# Patient Record
Sex: Female | Born: 1980 | Race: Black or African American | Hispanic: No | Marital: Single | Smoking: Never smoker
Health system: Southern US, Community
[De-identification: ages and names within clinical notes are randomized; demographics above are authoritative.]

## PROBLEM LIST (undated history)

## (undated) DIAGNOSIS — Z8742 Personal history of other diseases of the female genital tract: Secondary | ICD-10-CM

## (undated) DIAGNOSIS — B009 Herpesviral infection, unspecified: Secondary | ICD-10-CM

## (undated) DIAGNOSIS — R102 Pelvic and perineal pain: Secondary | ICD-10-CM

## (undated) HISTORY — DX: Herpesviral infection, unspecified: B00.9

## (undated) HISTORY — DX: Personal history of other diseases of the female genital tract: Z87.42

## (undated) HISTORY — DX: Pelvic and perineal pain: R10.2

---

## 2012-03-10 ENCOUNTER — Encounter: Payer: Self-pay | Admitting: Obstetrics and Gynecology

## 2012-03-10 ENCOUNTER — Ambulatory Visit (INDEPENDENT_AMBULATORY_CARE_PROVIDER_SITE_OTHER): Payer: PRIVATE HEALTH INSURANCE | Admitting: Obstetrics and Gynecology

## 2012-03-10 VITALS — BP 90/62 | HR 68 | Resp 16 | Ht 64.0 in | Wt 150.0 lb

## 2012-03-10 DIAGNOSIS — N946 Dysmenorrhea, unspecified: Secondary | ICD-10-CM

## 2012-03-10 DIAGNOSIS — D219 Benign neoplasm of connective and other soft tissue, unspecified: Secondary | ICD-10-CM

## 2012-03-10 DIAGNOSIS — D259 Leiomyoma of uterus, unspecified: Secondary | ICD-10-CM

## 2012-03-10 DIAGNOSIS — N926 Irregular menstruation, unspecified: Secondary | ICD-10-CM | POA: Insufficient documentation

## 2012-03-10 DIAGNOSIS — E282 Polycystic ovarian syndrome: Secondary | ICD-10-CM | POA: Insufficient documentation

## 2012-03-10 MED ORDER — MISOPROSTOL 200 MCG PO TABS: ORAL_TABLET | ORAL | Status: DC

## 2012-03-10 MED ORDER — NORETHIN-ETH ESTRADIOL-FE 0.8-25 MG-MCG PO CHEW: 1.0000 | CHEWABLE_TABLET | Freq: Every day | ORAL | Status: DC

## 2012-03-10 NOTE — Progress Notes (Signed)
PCOS WITH ABNORMAL UTERINE BLEEDING ON BCPs  SUBJECTIVE: Pt on extended cycle BCPs for abnormal uterine bleeding and PCOS as well as severe dysmenorrhea.  BCPs are not totally effective in stopping bleeding and are financially prohibitive. Wants to consider another option.   Denies intermenstrual pain.No other GI symptoms or urinary symptoms.  EXAM: BP 90/62  Pulse 68  Resp 16  Ht 5\' 4"  (1.626 m)  Wt 150 lb (68.04 kg)  BMI 25.75 kg/m2  LMP 02/12/2012 Pelvic exam:  VULVA: normal appearing vulva with no masses, tenderness or lesions,    VAGINA: normal appearing vagina with normal color and discharge, no lesions,    CERVIX: normal appearing cervix without discharge or lesions, cervical motion tenderness absent,    UTERUS: enlarged to 8 week's size,    ADNEXA: normal adnexa in size, nontender and no masses,    RECTAL: normal rectal, no masses. ASSESSMENT: PCOS    Abnormal uterine bleeding on extended cycle BCPs    Fibroids    Dysmenorrhea  RECOMMENDATION: Consider Mirena IUD for relief of several symptoms     Pt agrees     Continue BCPs until IUD is placed     Cytotec for cervical ripening prior to IUD placement

## 2012-03-10 NOTE — Patient Instructions (Addendum)
Continue taking active BCPs until IUD insertion. (Sample given) Cytotec :  1 tablet in vagina 12 hours before IUD appt                                1 tablet in vagina 6 hours before IUD appt   Levonorgestrel intrauterine device (IUD) What is this medicine? LEVONORGESTREL IUD (LEE voe nor jes trel) is a contraceptive (birth control) device. It is used to prevent pregnancy and to treat heavy bleeding that occurs during your period. It can be used for up to 5 years. This medicine may be used for other purposes; ask your health care provider or pharmacist if you have questions. What should I tell my health care provider before I take this medicine? They need to know if you have any of these conditions: -abnormal Pap smear -cancer of the breast, uterus, or cervix -diabetes -endometritis -genital or pelvic infection now or in the past -have more than one sexual partner or your partner has more than one partner -heart disease -history of an ectopic or tubal pregnancy -immune system problems -IUD in place -liver disease or tumor -problems with blood clots or take blood-thinners -use intravenous drugs -uterus of unusual shape -vaginal bleeding that has not been explained -an unusual or allergic reaction to levonorgestrel, other hormones, silicone, or polyethylene, medicines, foods, dyes, or preservatives -pregnant or trying to get pregnant -breast-feeding How should I use this medicine? This device is placed inside the uterus by a health care professional. Talk to your pediatrician regarding the use of this medicine in children. Special care may be needed. Overdosage: If you think you have taken too much of this medicine contact a poison control center or emergency room at once. NOTE: This medicine is only for you. Do not share this medicine with others. What if I miss a dose? This does not apply. What may interact with this medicine? Do not take this medicine with any of the  following medications: -amprenavir -bosentan -fosamprenavir This medicine may also interact with the following medications: -aprepitant -barbiturate medicines for inducing sleep or treating seizures -bexarotene -griseofulvin -medicines to treat seizures like carbamazepine, ethotoin, felbamate, oxcarbazepine, phenytoin, topiramate -modafinil -pioglitazone -rifabutin -rifampin -rifapentine -some medicines to treat HIV infection like atazanavir, indinavir, lopinavir, nelfinavir, tipranavir, ritonavir -St. John's wort -warfarin This list may not describe all possible interactions. Give your health care provider a list of all the medicines, herbs, non-prescription drugs, or dietary supplements you use. Also tell them if you smoke, drink alcohol, or use illegal drugs. Some items may interact with your medicine. What should I watch for while using this medicine? Visit your doctor or health care professional for regular check ups. See your doctor if you or your partner has sexual contact with others, becomes HIV positive, or gets a sexual transmitted disease. This product does not protect you against HIV infection (AIDS) or other sexually transmitted diseases. You can check the placement of the IUD yourself by reaching up to the top of your vagina with clean fingers to feel the threads. Do not pull on the threads. It is a good habit to check placement after each menstrual period. Call your doctor right away if you feel more of the IUD than just the threads or if you cannot feel the threads at all. The IUD may come out by itself. You may become pregnant if the device comes out. If you notice that the IUD  has come out use a backup birth control method like condoms and call your health care provider. Using tampons will not change the position of the IUD and are okay to use during your period. What side effects may I notice from receiving this medicine? Side effects that you should report to your  doctor or health care professional as soon as possible: -allergic reactions like skin rash, itching or hives, swelling of the face, lips, or tongue -fever, flu-like symptoms -genital sores -high blood pressure -no menstrual period for 6 weeks during use -pain, swelling, warmth in the leg -pelvic pain or tenderness -severe or sudden headache -signs of pregnancy -stomach cramping -sudden shortness of breath -trouble with balance, talking, or walking -unusual vaginal bleeding, discharge -yellowing of the eyes or skin Side effects that usually do not require medical attention (report to your doctor or health care professional if they continue or are bothersome): -acne -breast pain -change in sex drive or performance -changes in weight -cramping, dizziness, or faintness while the device is being inserted -headache -irregular menstrual bleeding within first 3 to 6 months of use -nausea This list may not describe all possible side effects. Call your doctor for medical advice about side effects. You may report side effects to FDA at 1-800-FDA-1088. Where should I keep my medicine? This does not apply. NOTE: This sheet is a summary. It may not cover all possible information. If you have questions about this medicine, talk to your doctor, pharmacist, or health care provider.  2012, [employer]. (11/11/2008 6:39:08 PM)

## 2012-04-07 ENCOUNTER — Encounter: Payer: PRIVATE HEALTH INSURANCE | Admitting: Obstetrics and Gynecology

## 2012-09-22 ENCOUNTER — Other Ambulatory Visit: Payer: Self-pay | Admitting: Obstetrics and Gynecology

## 2012-09-22 MED ORDER — LEVONORGESTREL-ETHINYL ESTRAD 0.1-20 MG-MCG PO TABS: 1.0000 | ORAL_TABLET | Freq: Every day | ORAL | Status: DC

## 2012-09-22 NOTE — Telephone Encounter (Signed)
Returned pt call sending rx of BC until annual. Aex scheduled 12/07/12 with [facility] Pt voiced understanding Debby Clyne, Herbert Seta

## 2012-12-07 ENCOUNTER — Encounter: Payer: Self-pay | Admitting: Obstetrics and Gynecology

## 2012-12-07 ENCOUNTER — Ambulatory Visit: Payer: BC Managed Care – PPO | Admitting: Obstetrics and Gynecology

## 2012-12-07 VITALS — BP 100/78 | HR 64 | Ht 64.0 in | Wt 151.0 lb

## 2012-12-07 DIAGNOSIS — D219 Benign neoplasm of connective and other soft tissue, unspecified: Secondary | ICD-10-CM

## 2012-12-07 DIAGNOSIS — Z124 Encounter for screening for malignant neoplasm of cervix: Secondary | ICD-10-CM

## 2012-12-07 DIAGNOSIS — R5383 Other fatigue: Secondary | ICD-10-CM

## 2012-12-07 DIAGNOSIS — E282 Polycystic ovarian syndrome: Secondary | ICD-10-CM

## 2012-12-07 DIAGNOSIS — N946 Dysmenorrhea, unspecified: Secondary | ICD-10-CM

## 2012-12-07 DIAGNOSIS — Z202 Contact with and (suspected) exposure to infections with a predominantly sexual mode of transmission: Secondary | ICD-10-CM

## 2012-12-07 DIAGNOSIS — N926 Irregular menstruation, unspecified: Secondary | ICD-10-CM

## 2012-12-07 LAB — HEMOGLOBIN: Hemoglobin: 11.6 g/dL — ABNORMAL LOW (ref 12.0–15.0)

## 2012-12-07 LAB — HIV ANTIBODY (ROUTINE TESTING W REFLEX): HIV: NONREACTIVE

## 2012-12-07 MED ORDER — LEVONORGESTREL-ETHINYL ESTRAD 0.1-20 MG-MCG PO TABS: ORAL_TABLET | ORAL | Status: DC

## 2012-12-07 NOTE — Progress Notes (Signed)
Subjective:  Last Pap: 10/11/10, due today per last note WNL: Yes Regular Periods:yes Contraception: pill   Monthly Breast exam:yes Tetanus<16yrs:yes Nl.Bladder Function:yes Daily BMs:no Healthy Diet:yes somewhat  Calcium:no Mammogram:no Date of Mammogram: n/a Exercise:yes Have often Exercise: 2-3 times per week  Seatbelt: yes Abuse at home: no Stressful work:yes sometimes  Sigmoid-colonoscopy: n/a Bone Density: No PCP: none, Pt would like referral  Change in PMH: no changes  Change in [facility]: no changes   Whitney Bradford is a 32 y.o. female, G0P0, who presents for an annual exam. C/o fatigue evern after 6 hrs sleep nightly.  Has hx low vit D.  Has mild ice pica.    History   Social History  . Marital Status: Single    Spouse Name: N/A    Number of Children: N/A  . Years of Education: N/A   Social History Main Topics  . Smoking status: Never Smoker   . Smokeless tobacco: None  . Alcohol Use: Yes  . Drug Use: Yes    Special: Marijuana  . Sexually Active: Yes    Birth Control/ Protection: Pill     Comment: aleese    Other Topics Concern  . None   Social History Narrative  . None    Menstrual cycle:   LMP: Patient's last menstrual period was 11/16/2012.           Cycle: Monthly menses heavy, but not as heavy as prior to surgery.  Tried extended cycle  BCP regimen, but insurance company would not cover it.  The following portions of the patient's history were reviewed and updated as appropriate: allergies, current medications, past family history, past medical history, past social history, past surgical history and problem list.  Review of Systems Pertinent items are noted in HPI. Breast:Negative for breast lump,nipple discharge or nipple retraction Gastrointestinal: Negative for abdominal pain, change in bowel habits or rectal bleeding Urinary:negative   Objective:    BP 100/78  Pulse 64  Ht 5\' 4"  (1.626 m)  Wt 151 lb (68.493 kg)  BMI 25.92 kg/m2  LMP  11/16/2012    Weight:  Wt Readings from Last 1 Encounters:  12/07/12 151 lb (68.493 kg)          BMI: Body mass index is 25.92 kg/(m^2).  General Appearance: Alert, appropriate appearance for age. No acute distress HEENT: Grossly normal Neck / Thyroid: Supple, no masses, nodes or enlargement Lungs: clear to auscultation bilaterally Back: No CVA tenderness Breast Exam: No masses or nodes.No dimpling, nipple retraction or discharge. Cardiovascular: Regular rate and rhythm. S1, S2, no murmur Gastrointestinal: Soft, non-tender, no masses or organomegaly Pelvic Exam: External genitalia: normal general appearance Vaginal: normal mucosa without prolapse or lesions Cervix: normal appearance Adnexa: no masses noted Uterus: ULNS Rectovaginal: normal rectal, no masses Lymphatic Exam: Non-palpable nodes in neck, clavicular, axillary, or inguinal regions Skin: no rash or abnormalities Neurologic: Normal gait and speech, no tremor  Psychiatric: Alert and oriented, appropriate affect.   Wet Prep:not applicable Urinalysis:not applicable UPT: Not done   Assessment:    Known fibroids  Improved menorrhagia fatigue   Plan:    pap smear counseled on breast self exam, use and side effects of OCP's and family planning choices return annually or prn STD screening: done Contraception:oral contraceptives (estrogen/progesterone) HBG, TSH, VITD   Dierdre Forth MD

## 2012-12-08 LAB — VITAMIN D 25 HYDROXY (VIT D DEFICIENCY, FRACTURES): Vit D, 25-Hydroxy: 38 ng/mL (ref 30–89)

## 2012-12-09 LAB — PAP IG, CT-NG NAA, HPV HIGH-RISK
Chlamydia Probe Amp: NEGATIVE
GC Probe Amp: NEGATIVE

## 2014-01-10 ENCOUNTER — Ambulatory Visit: Payer: BC Managed Care – PPO

## 2014-01-10 ENCOUNTER — Ambulatory Visit: Payer: BC Managed Care – PPO | Admitting: Family Medicine

## 2014-01-10 VITALS — BP 110/70 | HR 83 | Temp 98.0°F | Resp 16 | Ht 64.0 in | Wt 155.0 lb

## 2014-01-10 DIAGNOSIS — T148XXA Other injury of unspecified body region, initial encounter: Secondary | ICD-10-CM

## 2014-01-10 DIAGNOSIS — M549 Dorsalgia, unspecified: Secondary | ICD-10-CM

## 2014-01-10 DIAGNOSIS — S20219A Contusion of unspecified front wall of thorax, initial encounter: Secondary | ICD-10-CM

## 2014-01-10 DIAGNOSIS — R079 Chest pain, unspecified: Secondary | ICD-10-CM

## 2014-01-10 DIAGNOSIS — M542 Cervicalgia: Secondary | ICD-10-CM

## 2014-01-10 MED ORDER — METHOCARBAMOL 500 MG PO TABS: 500.0000 mg | ORAL_TABLET | Freq: Three times a day (TID) | ORAL | Status: DC | PRN

## 2014-01-10 MED ORDER — TRAMADOL HCL 50 MG PO TABS: 50.0000 mg | ORAL_TABLET | Freq: Three times a day (TID) | ORAL | Status: DC | PRN

## 2014-01-10 MED ORDER — MELOXICAM 15 MG PO TABS: ORAL_TABLET | ORAL | Status: DC
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
## 2014-04-20 ENCOUNTER — Ambulatory Visit (INDEPENDENT_AMBULATORY_CARE_PROVIDER_SITE_OTHER): Payer: BC Managed Care – PPO | Admitting: Emergency Medicine

## 2014-04-20 VITALS — BP 110/70 | HR 75 | Temp 97.7°F | Resp 20 | Ht 64.5 in | Wt 157.0 lb

## 2014-04-20 DIAGNOSIS — A088 Other specified intestinal infections: Secondary | ICD-10-CM

## 2014-04-20 DIAGNOSIS — A084 Viral intestinal infection, unspecified: Secondary | ICD-10-CM

## 2014-04-20 MED ORDER — LOPERAMIDE HCL 2 MG PO TABS: ORAL_TABLET | ORAL | Status: DC

## 2014-04-20 MED ORDER — ONDANSETRON 8 MG PO TBDP: 8.0000 mg | ORAL_TABLET | Freq: Three times a day (TID) | ORAL | Status: DC | PRN

## 2014-04-20 NOTE — Progress Notes (Signed)
[facility] [address] [place] [address] [phone]  Date:  04/20/2014   Name:  Whitney Bradford   DOB:  19-Jan-1981   MRN:  [id number]  PCP:  Eldred Manges, MD    Chief Complaint: Nausea   History of Present Illness:  Whitney Bradford is a 33 y.o. very pleasant female patient who presents with the following:  Stopped OCP in February.  Now using condoms.  Last period May 31.  Now has nausea and vomiting and fatigue over past four days.  Has diarrhea.  The patient has no complaint of blood, mucous, or pus in her stools. No travel or sketchy water.  No fever or chills.  No rash. No ill contacts.  Had negative HCG at home.  No GU or GYN.  No improvement with over the counter medications or other home remedies. Denies other complaint or health concern today.   Patient Active Problem List   Diagnosis Date Noted  . PCOS (polycystic ovarian syndrome) 03/10/2012  . Dysmenorrhea 03/10/2012  . Fibroid 03/10/2012  . Irregular menses 03/10/2012    Past Medical History  Diagnosis Date  . History of PCOS   . Pelvic pain   . HSV-1 infection     No past surgical history on file.  History  Substance Use Topics  . Smoking status: Never Smoker   . Smokeless tobacco: Not on file  . Alcohol Use: Yes    Family History  Problem Relation Age of Onset  . Hypertension Father   . Hypertension Mother   . Hypertension Sister   . Hypertension Sister     Allergies  Allergen Reactions  . Sulfa Antibiotics Nausea And Vomiting    Medication list has been reviewed and updated.  Current Outpatient Prescriptions on File Prior to Visit  Medication Sig Dispense Refill  . meloxicam (MOBIC) 15 MG tablet Take 1/2 -1 tab PO daily prn, no other NSAIDs, always with food.  30 tablet  0  . methocarbamol (ROBAXIN) 500 MG tablet Take 1 tablet (500 mg total) by mouth every 8 (eight) hours as needed for muscle spasms.  30 tablet  0  . naproxen (NAPROSYN) 250 MG tablet Take  by mouth 2 (two) times daily with a meal.      . traMADol (ULTRAM) 50 MG tablet Take 1 tablet (50 mg total) by mouth every 8 (eight) hours as needed.  30 tablet  0   No current facility-administered medications on file prior to visit.    Review of Systems:  As per HPI, otherwise negative.    Physical Examination: Filed Vitals:   04/20/14 1801  BP: 110/70  Pulse: 75  Temp: 97.7 F (36.5 C)  Resp: 20   Filed Vitals:   04/20/14 1801  Height: 5' 4.5" (1.638 m)  Weight: 157 lb (71.215 kg)   Body mass index is 26.54 kg/(m^2). Ideal Body Weight: Weight in (lb) to have BMI = 25: 147.6  GEN: WDWN, NAD, Non-toxic, A & O x 3 HEENT: Atraumatic, Normocephalic. Neck supple. No masses, No LAD. Ears and Nose: No external deformity. CV: RRR, No M/G/R. No JVD. No thrill. No extra heart sounds. PULM: CTA B, no wheezes, crackles, rhonchi. No retractions. No resp. distress. No accessory muscle use. ABD: S, NT, ND, +BS. No rebound. No HSM. EXTR: No c/c/e NEURO Normal gait.  PSYCH: Normally interactive. Conversant. Not depressed or anxious appearing.  Calm demeanor.    Assessment and Plan: Gastroenteritis Imodium zofran   Signed,  Ellison Carwin, MD

## 2014-04-20 NOTE — Patient Instructions (Signed)
Clear Liquid Diet A clear liquid diet is a short-term diet that is prescribed to provide the necessary fluid and basic energy you need when you can have nothing else. The clear liquid diet consists of liquids or solids that will become liquid at room temperature. You should be able to see through the liquid. There are many reasons that you may be restricted to clear liquids, such as:  When you have a sudden-onset (acute) condition that occurs before or after surgery.  To help your body slowly get adjusted to food again after a long period when you were unable to have food.  Replacement of fluids when you have a diarrheal disease.  When you are going to have certain exams, such as a colonoscopy, in which instruments are inserted inside your body to look at parts of your digestive system. WHAT CAN I HAVE? A clear liquid diet does not provide all the nutrients you need. It is important to choose a variety of the following items to get as many nutrients as possible:  Vegetable juices that do not have pulp.  Fruit juices and fruit drinks that do not have pulp.  Coffee (regular or decaffeinated), tea, or soda at the discretion of your health care provider.  Clear bouillon, broth, or strained broth-based soups.  High-protein and flavored gelatins.  Sugar or honey.  Ices or frozen ice pops that do not contain milk. If you are not sure whether you can have certain items, you should ask your health care provider. You may also ask your health care provider if there are any other clear liquid options. Document Released: 10/21/2005 Document Revised: 10/26/2013 Document Reviewed: 09/17/2013 ExitCare Patient Information 2015 [employer]. This information is not intended to replace advice given to you by your health care provider. Make sure you discuss any questions you have with your health care provider. Viral Gastroenteritis Viral gastroenteritis is also known as stomach flu. This condition  affects the stomach and intestinal tract. It can cause sudden diarrhea and vomiting. The illness typically lasts 3 to 8 days. Most people develop an immune response that eventually gets rid of the virus. While this natural response develops, the virus can make you quite ill. CAUSES  Many different viruses can cause gastroenteritis, such as rotavirus or noroviruses. You can catch one of these viruses by consuming contaminated food or water. You may also catch a virus by sharing utensils or other personal items with an infected person or by touching a contaminated surface. SYMPTOMS  The most common symptoms are diarrhea and vomiting. These problems can cause a severe loss of body fluids (dehydration) and a body salt (electrolyte) imbalance. Other symptoms may include:  Fever.  Headache.  Fatigue.  Abdominal pain. DIAGNOSIS  Your caregiver can usually diagnose viral gastroenteritis based on your symptoms and a physical exam. A stool sample may also be taken to test for the presence of viruses or other infections. TREATMENT  This illness typically goes away on its own. Treatments are aimed at rehydration. The most serious cases of viral gastroenteritis involve vomiting so severely that you are not able to keep fluids down. In these cases, fluids must be given through an intravenous line (IV). HOME CARE INSTRUCTIONS   Drink enough fluids to keep your urine clear or pale yellow. Drink small amounts of fluids frequently and increase the amounts as tolerated.  Ask your caregiver for specific rehydration instructions.  Avoid:  Foods high in sugar.  Alcohol.  Carbonated drinks.  Tobacco.  Juice.    Caffeine drinks.  Extremely hot or cold fluids.  Fatty, greasy foods.  Too much intake of anything at one time.  Dairy products until 24 to 48 hours after diarrhea stops.  You may consume probiotics. Probiotics are active cultures of beneficial bacteria. They may lessen the amount and  number of diarrheal stools in adults. Probiotics can be found in yogurt with active cultures and in supplements.  Wash your hands well to avoid spreading the virus.  Only take over-the-counter or prescription medicines for pain, discomfort, or fever as directed by your caregiver. Do not give aspirin to children. Antidiarrheal medicines are not recommended.  Ask your caregiver if you should continue to take your regular prescribed and over-the-counter medicines.  Keep all follow-up appointments as directed by your caregiver. SEEK IMMEDIATE MEDICAL CARE IF:   You are unable to keep fluids down.  You do not urinate at least once every 6 to 8 hours.  You develop shortness of breath.  You notice blood in your stool or vomit. This may look like coffee grounds.  You have abdominal pain that increases or is concentrated in one small area (localized).  You have persistent vomiting or diarrhea.  You have a fever.  The patient is a child younger than 3 months, and he or she has a fever.  The patient is a child older than 3 months, and he or she has a fever and persistent symptoms.  The patient is a child older than 3 months, and he or she has a fever and symptoms suddenly get worse.  The patient is a baby, and he or she has no tears when crying. MAKE SURE YOU:   Understand these instructions.  Will watch your condition.  Will get help right away if you are not doing well or get worse. Document Released: 10/21/2005 Document Revised: 01/13/2012 Document Reviewed: 08/07/2011 ExitCare Patient Information 2015 [employer]. This information is not intended to replace advice given to you by your health care provider. Make sure you discuss any questions you have with your health care provider.  

## 2014-06-09 DIAGNOSIS — Z0271 Encounter for disability determination: Secondary | ICD-10-CM

## 2015-01-11 ENCOUNTER — Ambulatory Visit (INDEPENDENT_AMBULATORY_CARE_PROVIDER_SITE_OTHER): Payer: BLUE CROSS/BLUE SHIELD | Admitting: Emergency Medicine

## 2015-01-11 VITALS — BP 110/70 | HR 70 | Temp 97.8°F | Resp 12 | Ht 64.0 in | Wt 153.5 lb

## 2015-01-11 DIAGNOSIS — J0140 Acute pansinusitis, unspecified: Secondary | ICD-10-CM

## 2015-01-11 DIAGNOSIS — J029 Acute pharyngitis, unspecified: Secondary | ICD-10-CM

## 2015-01-11 MED ORDER — PSEUDOEPHEDRINE-GUAIFENESIN ER 60-600 MG PO TB12
1.0000 | ORAL_TABLET | Freq: Two times a day (BID) | ORAL | Status: AC
Start: 2015-01-11 — End: 2016-01-11

## 2015-01-11 MED ORDER — AMOXICILLIN-POT CLAVULANATE 875-125 MG PO TABS: 1.0000 | ORAL_TABLET | Freq: Two times a day (BID) | ORAL | Status: DC

## 2015-01-11 MED ORDER — TRIAMCINOLONE ACETONIDE 55 MCG/ACT NA AERO: 2.0000 | INHALATION_SPRAY | Freq: Every day | NASAL | Status: AC

## 2015-01-11 NOTE — Patient Instructions (Signed)

## 2015-01-11 NOTE — Progress Notes (Signed)
[facility] [address],  [place] [address] [phone]  Date:  01/11/2015   Name:  Whitney Bradford   DOB:  1981/01/27   MRN:  [id number]  PCP:  Eldred Manges, MD    Chief Complaint: Sinus Drainage; Sore Throat; and Ear Pressure   History of Present Illness:  Whitney Bradford is a 34 y.o. very pleasant female patient who presents with the following:  Ill with history of nasal congestion and ear pressure Has purulent nasal drainage and post nasal drip No cough or wheezing or shortness of breath Sore throat No nausea or vomiting No fever or chills No improvement with over the counter medications or other home remedies.  Denies other complaint or health concern today.   Patient Active Problem List   Diagnosis Date Noted  . PCOS (polycystic ovarian syndrome) 03/10/2012  . Dysmenorrhea 03/10/2012  . Fibroid 03/10/2012  . Irregular menses 03/10/2012    Past Medical History  Diagnosis Date  . History of PCOS   . Pelvic pain   . HSV-1 infection     History reviewed. No pertinent past surgical history.  History  Substance Use Topics  . Smoking status: Never Smoker   . Smokeless tobacco: Never Used  . Alcohol Use: 0.0 oz/week    0 Standard drinks or equivalent per week    Family History  Problem Relation Age of Onset  . Hypertension Father   . Heart disease Father   . Hypertension Mother   . Hypertension Sister   . Hypertension Sister     Allergies  Allergen Reactions  . Sulfa Antibiotics Nausea And Vomiting    Medication list has been reviewed and updated.  No current outpatient prescriptions on file prior to visit.   No current facility-administered medications on file prior to visit.    Review of Systems:  As per HPI, otherwise negative.    Physical Examination: Filed Vitals:   01/11/15 1813  BP: 110/70  Pulse: 70  Temp: 97.8 F (36.6 C)  Resp: 12   Filed Vitals:   01/11/15 1813  Height: 5\' 4"  (1.626 m)   Weight: 153 lb 8 oz (69.627 kg)   Body mass index is 26.34 kg/(m^2). Ideal Body Weight: Weight in (lb) to have BMI = 25: 145.3  GEN: WDWN, NAD, Non-toxic, A & O x 3 HEENT: Atraumatic, Normocephalic. Neck supple. No masses, No LAD. Ears and Nose: No external deformity. CV: RRR, No M/G/R. No JVD. No thrill. No extra heart sounds. PULM: CTA B, no wheezes, crackles, rhonchi. No retractions. No resp. distress. No accessory muscle use. ABD: S, NT, ND, +BS. No rebound. No HSM. EXTR: No c/c/e NEURO Normal gait.  PSYCH: Normally interactive. Conversant. Not depressed or anxious appearing.  Calm demeanor.    Assessment and Plan: Sinusitis Pharyngitis augmentin mucinex   Signed,  Ellison Carwin, MD

## 2015-02-16 IMAGING — CR DG CERVICAL SPINE 2 OR 3 VIEWS
2 series · 2 of 2 positions shown · non-contrast
Comparison: Concurrently obtained chest x-ray and thoracic spine
radiographs

CLINICAL DATA: Neck pain

EXAM:
CERVICAL SPINE - 2-3 VIEW

[AP]
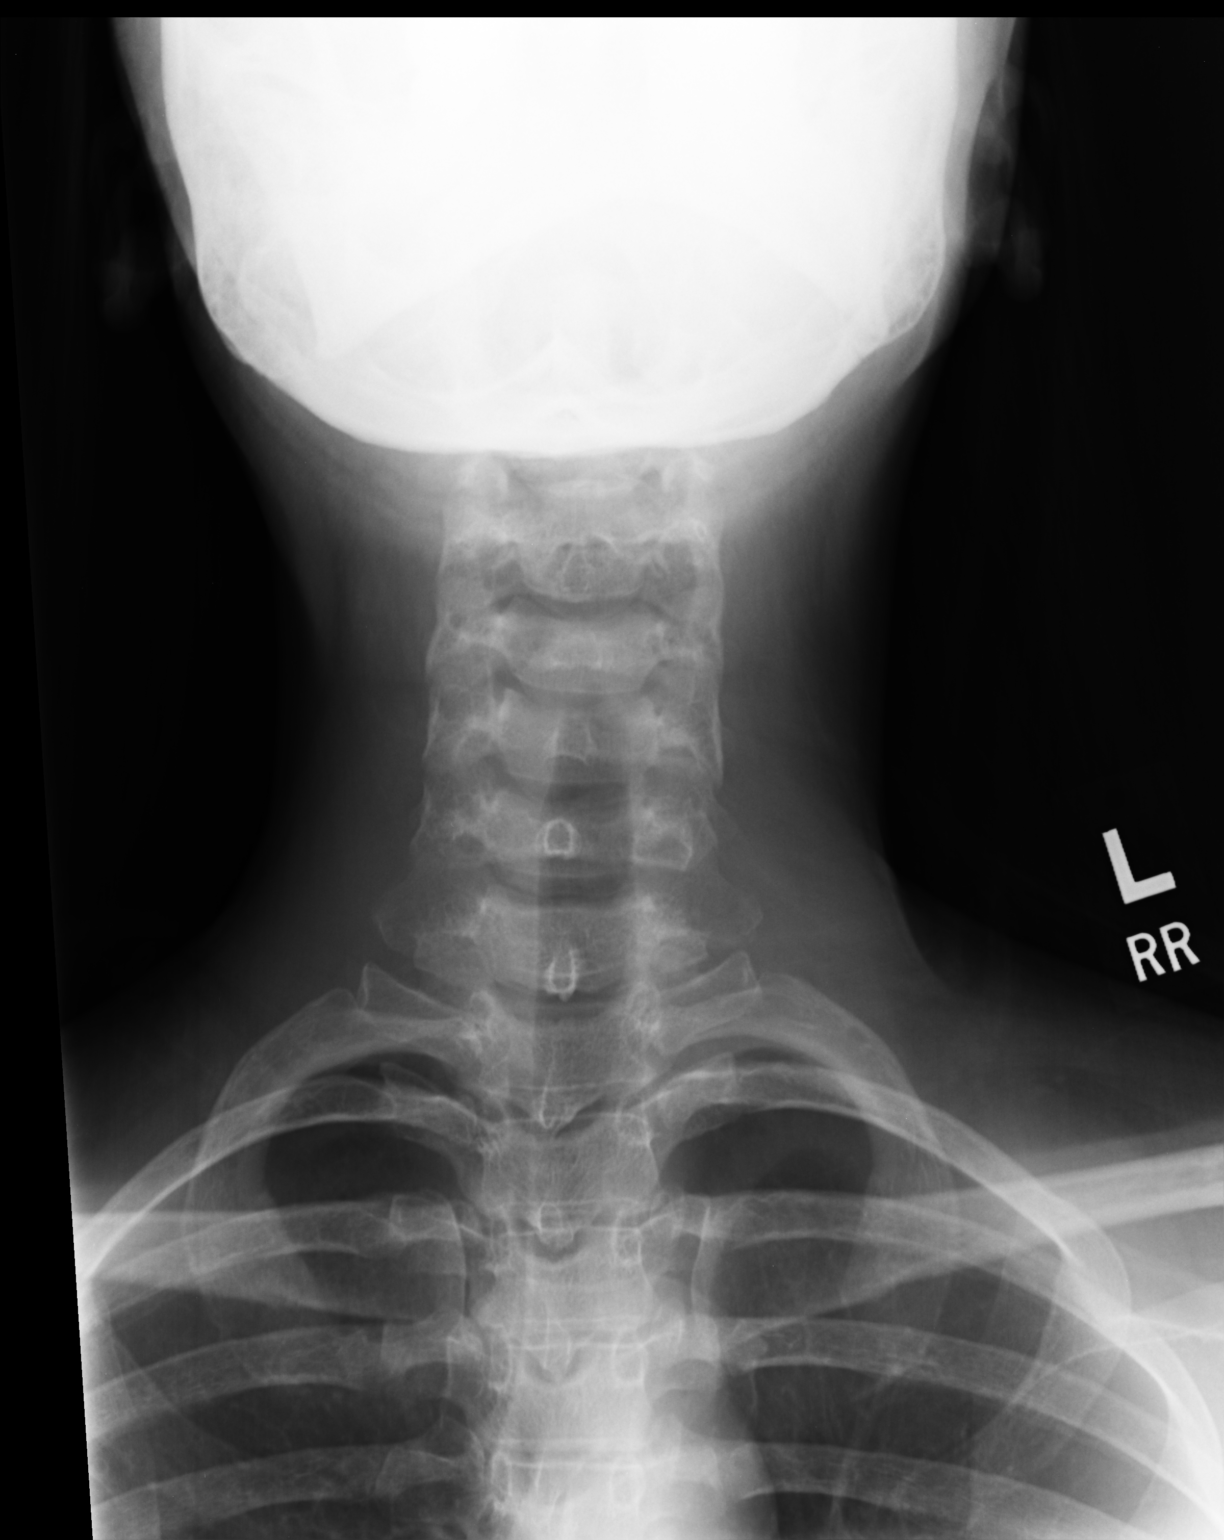

[lateral]
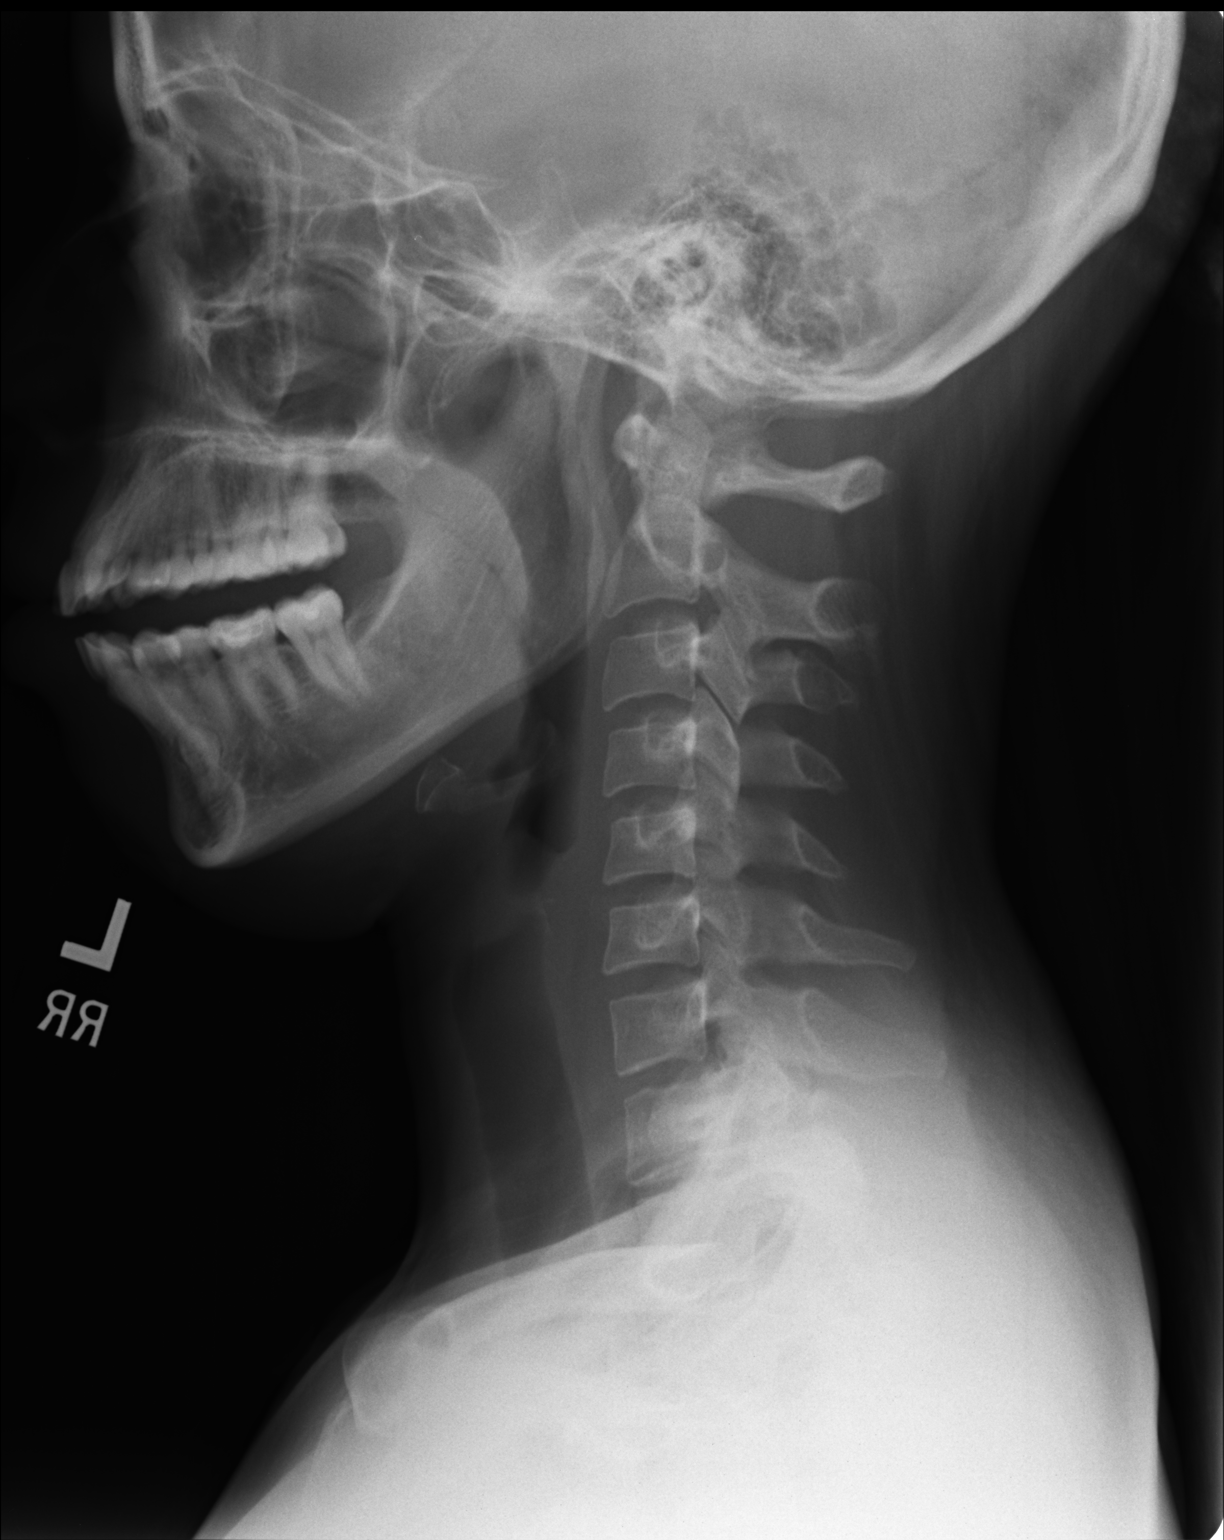

[2 of 2 positions shown; findings below may reference images not displayed]

FINDINGS: Frontal and lateral views of the cervical spine demonstrates no
evidence of acute fracture, malalignment or prevertebral soft tissue
swelling. No focal degenerative changes. No lytic or blastic osseous
lesion. Mild straightening of the cervical spine noted incidentally.
Visualized lung apices are within normal limits.
IMPRESSION: 1. No acute osseous abnormality or evidence of the significant
degenerative change.
2. Slight straightening of the normal cervical lordosis may be
positional or related to muscle spasm.

## 2015-02-16 IMAGING — CR DG CHEST 2V
2 series · 2 of 2 positions shown · non-contrast
Comparison: None.

CLINICAL DATA: Chest pain

EXAM:
CHEST  2 VIEW

[PA]
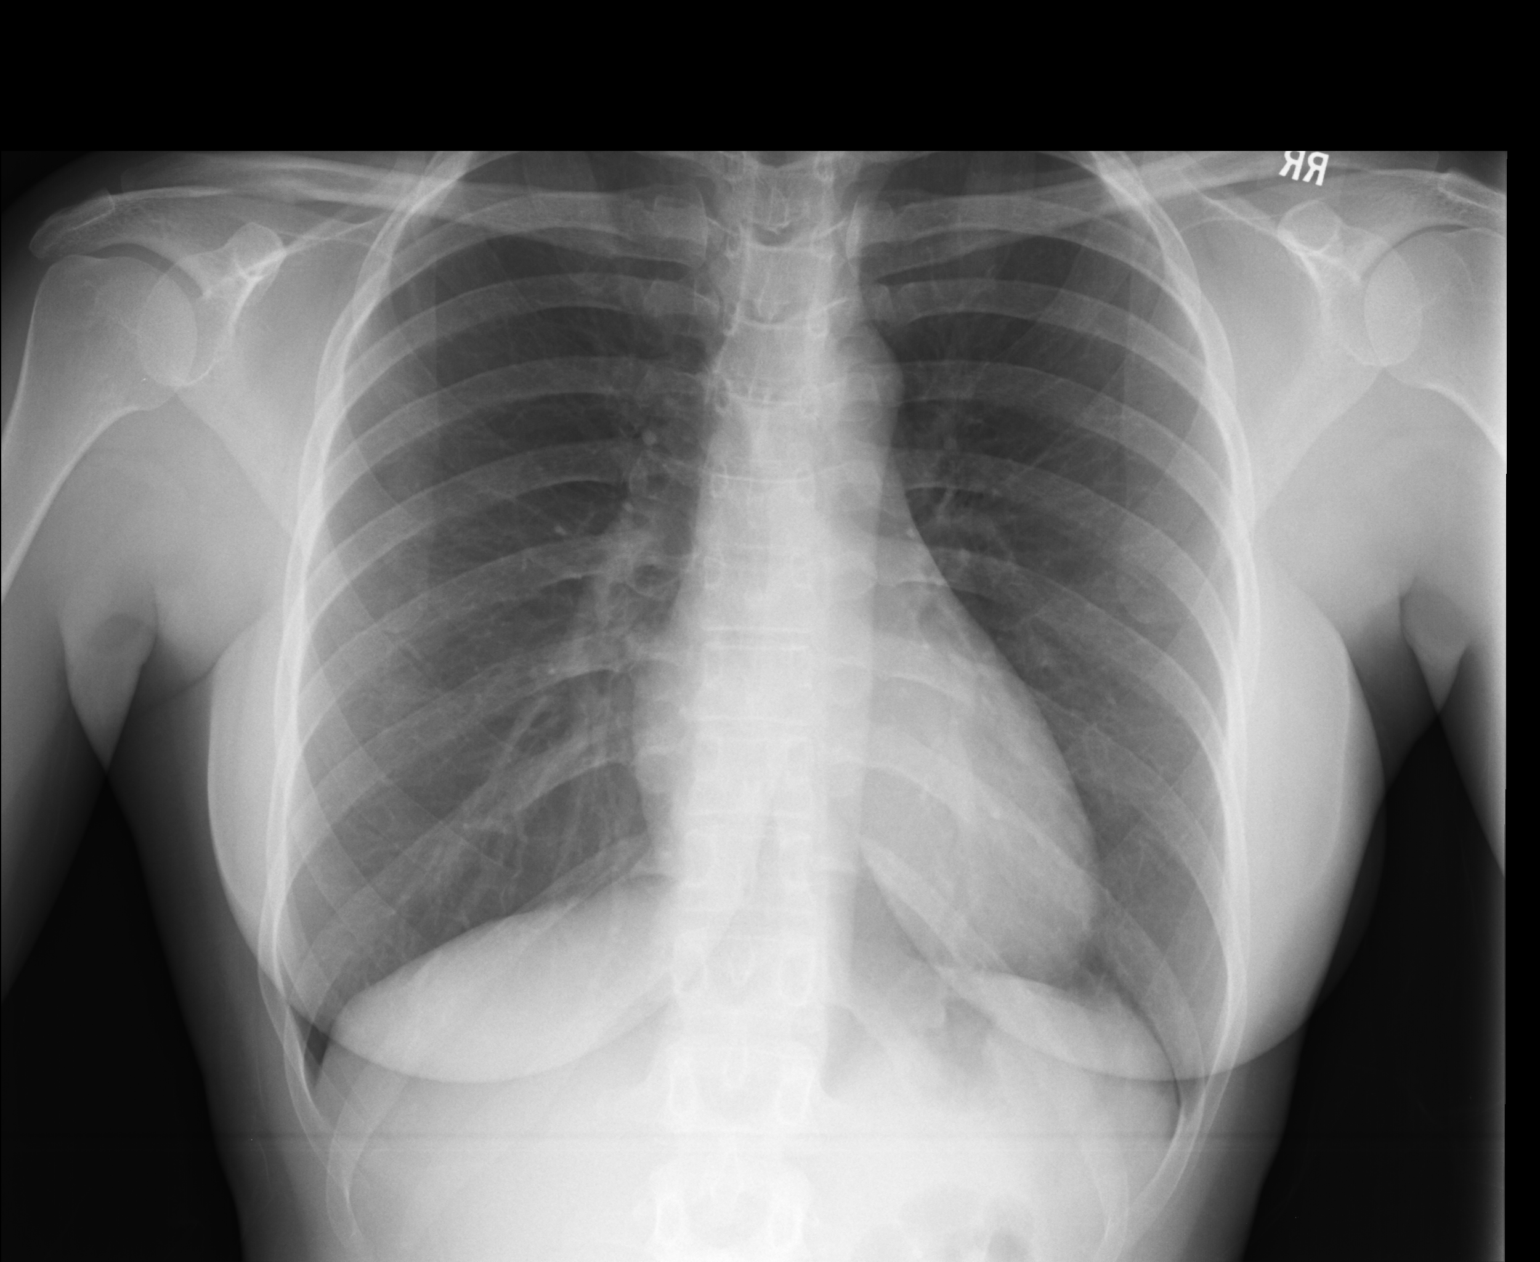

[lateral]
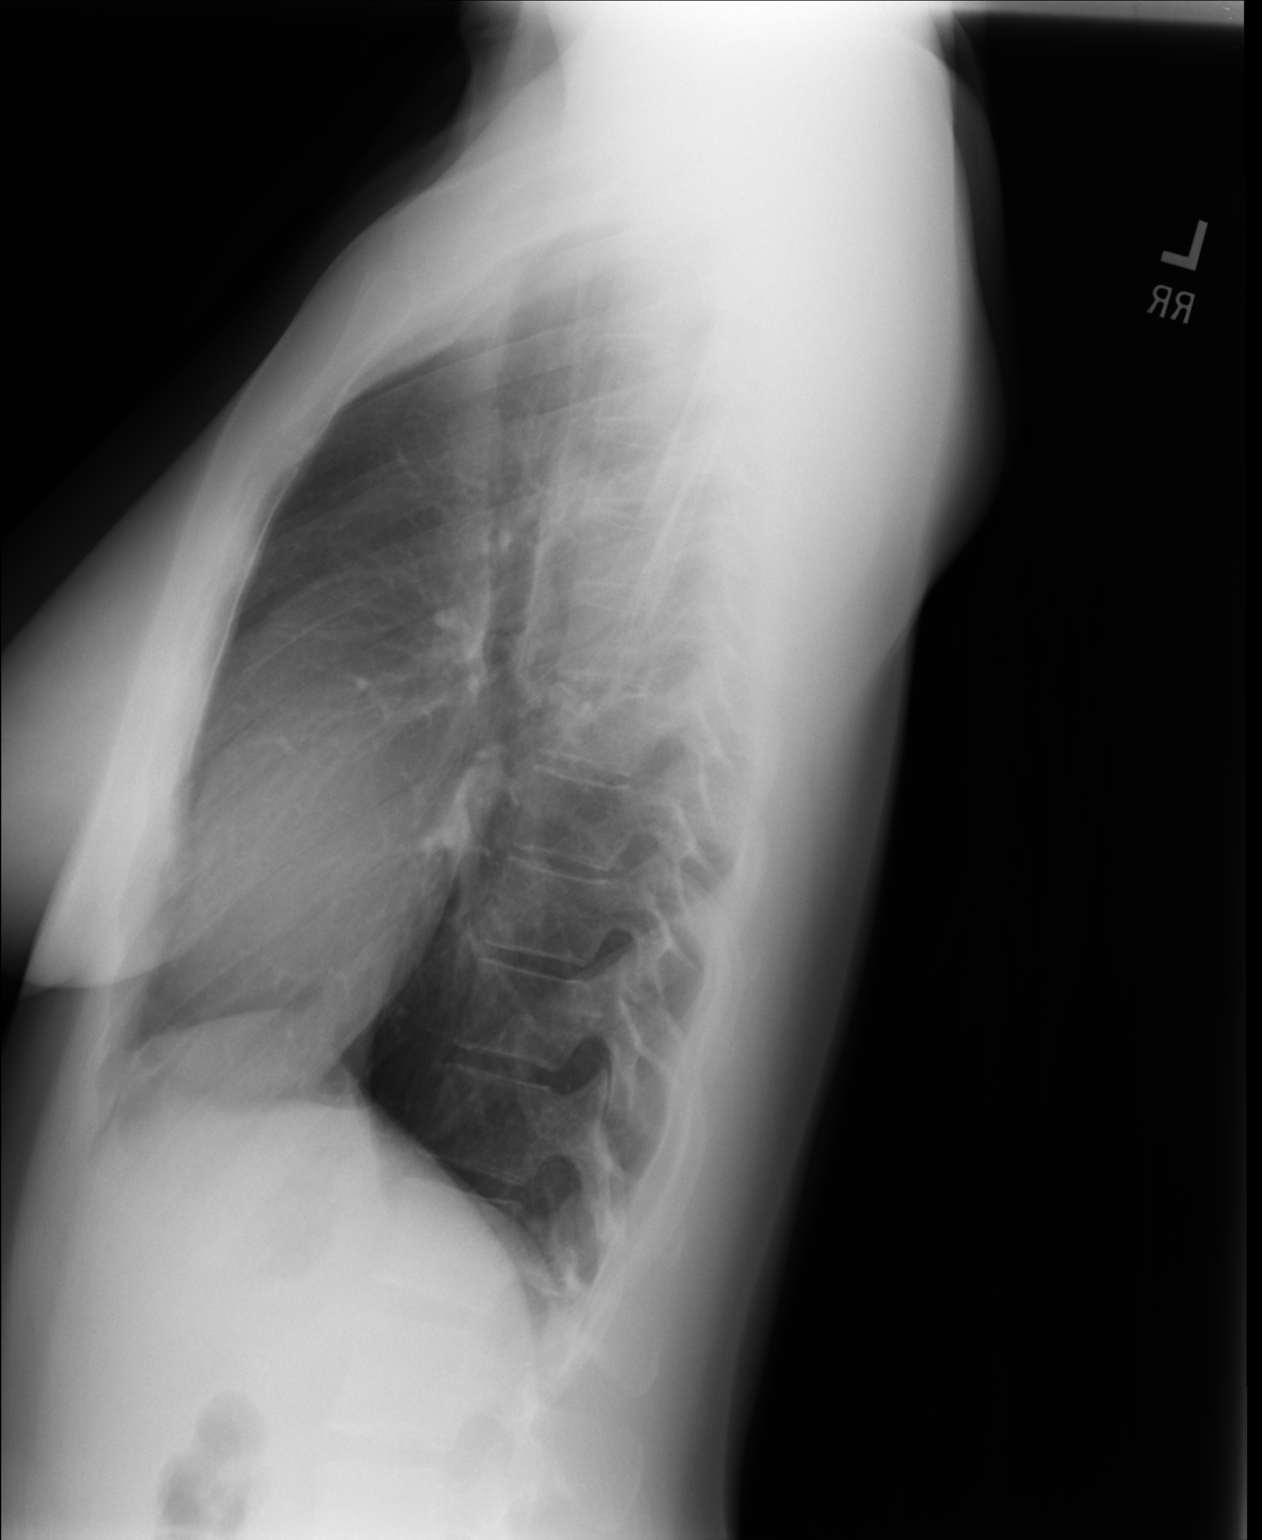

[2 of 2 positions shown; findings below may reference images not displayed]

FINDINGS: Lungs are clear. Heart size and pulmonary vascularity are normal. No
adenopathy. No pneumothorax. No bone lesions.
IMPRESSION: No abnormality noted.

## 2015-04-20 ENCOUNTER — Ambulatory Visit: Payer: BLUE CROSS/BLUE SHIELD | Admitting: Emergency Medicine

## 2015-04-20 ENCOUNTER — Ambulatory Visit (INDEPENDENT_AMBULATORY_CARE_PROVIDER_SITE_OTHER): Payer: BLUE CROSS/BLUE SHIELD | Admitting: Physician Assistant

## 2015-04-20 ENCOUNTER — Encounter: Payer: Self-pay | Admitting: Physician Assistant

## 2015-04-20 VITALS — BP 114/75 | HR 91 | Temp 98.5°F | Resp 16 | Ht 65.0 in | Wt 147.4 lb

## 2015-04-20 DIAGNOSIS — R05 Cough: Secondary | ICD-10-CM

## 2015-04-20 DIAGNOSIS — R0981 Nasal congestion: Secondary | ICD-10-CM

## 2015-04-20 DIAGNOSIS — R059 Cough, unspecified: Secondary | ICD-10-CM

## 2015-04-20 MED ORDER — GUAIFENESIN ER 1200 MG PO TB12: 1.0000 | ORAL_TABLET | Freq: Two times a day (BID) | ORAL | Status: AC | PRN

## 2015-04-20 MED ORDER — HYDROCOD POLST-CPM POLST ER 10-8 MG/5ML PO SUER: 5.0000 mL | Freq: Every evening | ORAL | Status: AC | PRN

## 2015-04-20 NOTE — Patient Instructions (Signed)
Upper Respiratory Infection, Adult An upper respiratory infection (URI) is also sometimes known as the common cold. The upper respiratory tract includes the nose, sinuses, throat, trachea, and bronchi. Bronchi are the airways leading to the lungs. Most people improve within 1 week, but symptoms can last up to 2 weeks. A residual cough may last even longer.  CAUSES Many different viruses can infect the tissues lining the upper respiratory tract. The tissues become irritated and inflamed and often become very moist. Mucus production is also common. A cold is contagious. You can easily spread the virus to others by oral contact. This includes kissing, sharing a glass, coughing, or sneezing. Touching your mouth or nose and then touching a surface, which is then touched by another person, can also spread the virus. SYMPTOMS  Symptoms typically develop 1 to 3 days after you come in contact with a cold virus. Symptoms vary from person to person. They may include:  Runny nose.  Sneezing.  Nasal congestion.  Sinus irritation.  Sore throat.  Loss of voice (laryngitis).  Cough.  Fatigue.  Muscle aches.  Loss of appetite.  Headache.  Low-grade fever. DIAGNOSIS  You might diagnose your own cold based on familiar symptoms, since most people get a cold 2 to 3 times a year. Your caregiver can confirm this based on your exam. Most importantly, your caregiver can check that your symptoms are not due to another disease such as strep throat, sinusitis, pneumonia, asthma, or epiglottitis. Blood tests, throat tests, and X-rays are not necessary to diagnose a common cold, but they may sometimes be helpful in excluding other more serious diseases. Your caregiver will decide if any further tests are required. RISKS AND COMPLICATIONS  You may be at risk for a more severe case of the common cold if you smoke cigarettes, have chronic heart disease (such as heart failure) or lung disease (such as asthma), or if  you have a weakened immune system. The very young and very old are also at risk for more serious infections. Bacterial sinusitis, middle ear infections, and bacterial pneumonia can complicate the common cold. The common cold can worsen asthma and chronic obstructive pulmonary disease (COPD). Sometimes, these complications can require emergency medical care and may be life-threatening. PREVENTION  The best way to protect against getting a cold is to practice good hygiene. Avoid oral or hand contact with people with cold symptoms. Wash your hands often if contact occurs. There is no clear evidence that vitamin C, vitamin E, echinacea, or exercise reduces the chance of developing a cold. However, it is always recommended to get plenty of rest and practice good nutrition. TREATMENT  Treatment is directed at relieving symptoms. There is no cure. Antibiotics are not effective, because the infection is caused by a virus, not by bacteria. Treatment may include:  Increased fluid intake. Sports drinks offer valuable electrolytes, sugars, and fluids.  Breathing heated mist or steam (vaporizer or shower).  Eating chicken soup or other clear broths, and maintaining good nutrition.  Getting plenty of rest.  Using gargles or lozenges for comfort.  Controlling fevers with ibuprofen or acetaminophen as directed by your caregiver.  Increasing usage of your inhaler if you have asthma. Zinc gel and zinc lozenges, taken in the first 24 hours of the common cold, can shorten the duration and lessen the severity of symptoms. Pain medicines may help with fever, muscle aches, and throat pain. A variety of non-prescription medicines are available to treat congestion and runny nose. Your caregiver   can make recommendations and may suggest nasal or lung inhalers for other symptoms.  HOME CARE INSTRUCTIONS   Only take over-the-counter or prescription medicines for pain, discomfort, or fever as directed by your  caregiver.  Use a warm mist humidifier or inhale steam from a shower to increase air moisture. This may keep secretions moist and make it easier to breathe.  Drink enough water and fluids to keep your urine clear or pale yellow.  Rest as needed.  Return to work when your temperature has returned to normal or as your caregiver advises. You may need to stay home longer to avoid infecting others. You can also use a face mask and careful hand washing to prevent spread of the virus. SEEK MEDICAL CARE IF:   After the first few days, you feel you are getting worse rather than better.  You need your caregiver's advice about medicines to control symptoms.  You develop chills, worsening shortness of breath, or brown or red sputum. These may be signs of pneumonia.  You develop yellow or brown nasal discharge or pain in the face, especially when you bend forward. These may be signs of sinusitis.  You develop a fever, swollen neck glands, pain with swallowing, or white areas in the back of your throat. These may be signs of strep throat. SEEK IMMEDIATE MEDICAL CARE IF:   You have a fever.  You develop severe or persistent headache, ear pain, sinus pain, or chest pain.  You develop wheezing, a prolonged cough, cough up blood, or have a change in your usual mucus (if you have chronic lung disease).  You develop sore muscles or a stiff neck. Document Released: 04/16/2001 Document Revised: 01/13/2012 Document Reviewed: 01/26/2014 ExitCare Patient Information 2015 [employer]. This information is not intended to replace advice given to you by your health care provider. Make sure you discuss any questions you have with your health care provider.  

## 2015-04-26 NOTE — Progress Notes (Signed)
[facility] [address] [place] [address] [phone]  Date:  04/20/2015   Name:  Whitney Bradford   DOB:  1981/02/11   MRN:  [id number]  PCP:  Whitney Manges, MD    History of Present Illness:  Whitney Bradford is a 34 y.o. female patient who presents to [facility] for chief complaint of nasal congestion, cough, and sore throat for 4 days.  Patient stated that on the first day, she had a fever, and nasal congestion, this has come to ca cough, that radiates up into her ears and and throat.  She has no ear pain or drainage.  The fever had resolved on the first day.  She has taken mucinex, alka-seltzer, and theraflu, which appeared like it is not helping much.  She has no sob or dyspnea.  She has no abdominal pain, nausea, or vomiting, or diarrhea.     Patient Active Problem List   Diagnosis Date Noted  . PCOS (polycystic ovarian syndrome) 03/10/2012  . Dysmenorrhea 03/10/2012  . Fibroid 03/10/2012  . Irregular menses 03/10/2012    Past Medical History  Diagnosis Date  . History of PCOS   . Pelvic pain   . HSV-1 infection     No past surgical history on file.  History  Substance Use Topics  . Smoking status: Never Smoker   . Smokeless tobacco: Never Used  . Alcohol Use: 0.0 oz/week    0 Standard drinks or equivalent per week    Family History  Problem Relation Age of Onset  . Hypertension Father   . Heart disease Father   . Hypertension Mother   . Hypertension Sister   . Hypertension Sister     Allergies  Allergen Reactions  . Sulfa Antibiotics Nausea And Vomiting    Medication list has been reviewed and updated.  Current Outpatient Prescriptions on File Prior to Visit  Medication Sig Dispense Refill  . pseudoephedrine-guaifenesin (MUCINEX D) 60-600 MG per tablet Take 1 tablet by mouth every 12 (twelve) hours. 18 tablet 0  . triamcinolone (NASACORT AQ) 55 MCG/ACT AERO nasal inhaler Place 2 sprays into the nose daily. 1 Inhaler 12   No  current facility-administered medications on file prior to visit.    ROS ROS otheriwse unremarkable unless listed above.    Physical Examination: BP 114/75 mmHg  Pulse 91  Temp(Src) 98.5 F (36.9 C) (Oral)  Resp 16  Ht 5\' 5"  (1.651 m)  Wt 147 lb 6.4 oz (66.86 kg)  BMI 24.53 kg/m2  SpO2 98%  LMP 03/28/2015 Ideal Body Weight: Weight in (lb) to have BMI = 25: 149.9  Physical Exam  Constitutional: She is oriented to person, place, and time. She appears well-developed and well-nourished. No distress.  HENT:  Head: Normocephalic and atraumatic.  Right Ear: Tympanic membrane, external ear and ear canal normal.  Left Ear: Tympanic membrane, external ear and ear canal normal.  Nose: Mucosal edema (mild) and rhinorrhea (clear) present. Right sinus exhibits no maxillary sinus tenderness and no frontal sinus tenderness. Left sinus exhibits no maxillary sinus tenderness and no frontal sinus tenderness.  Mouth/Throat: No oropharyngeal exudate, posterior oropharyngeal edema or posterior oropharyngeal erythema.  Eyes: EOM are normal. Pupils are equal, round, and reactive to light. Right eye exhibits no discharge. Left eye exhibits no discharge.  Neck: Normal range of motion.  Cardiovascular: Normal rate, regular rhythm and normal heart sounds.   Pulmonary/Chest: Effort normal and breath sounds normal. No respiratory distress. She has no wheezes.  Lymphadenopathy:    She has no cervical adenopathy.  Neurological: She is alert and oriented to person, place, and time.  Skin: Skin is warm and dry. She is not diaphoretic.  Psychiatric: She has a normal mood and affect. Her behavior is normal.     Assessment and Plan: 34 year old female is here today for chief complaint of nasal congestion, cough, and sore throat.  This is likely an upper respiratory infection of viral etiology.  Supportive treatment and for cough given.  Advised to contact via phone, if her sxs continue after 4 days.  1. Nasal  congestion - Guaifenesin (MUCINEX MAXIMUM STRENGTH) 1200 MG TB12; Take 1 tablet (1,200 mg total) by mouth every 12 (twelve) hours as needed.  Dispense: 14 tablet; Refill: 1  2. Cough - Guaifenesin (MUCINEX MAXIMUM STRENGTH) 1200 MG TB12; Take 1 tablet (1,200 mg total) by mouth every 12 (twelve) hours as needed.  Dispense: 14 tablet; Refill: 1 - chlorpheniramine-HYDROcodone (TUSSIONEX PENNKINETIC ER) 10-8 MG/5ML SUER; Take 5 mLs by mouth at bedtime as needed for cough.  Dispense: 60 mL; Refill: 0   Ivar Drape, PA-C [facility] 04/26/2015 11:15 AM
